# Patient Record
Sex: Female | Born: 1975 | Race: White | Hispanic: No | Marital: Married
Health system: Southern US, Community
[De-identification: ages and names within clinical notes are randomized; demographics above are authoritative.]

---

## 2009-07-27 ENCOUNTER — Ambulatory Visit: Payer: Self-pay

## 2010-05-05 ENCOUNTER — Ambulatory Visit: Payer: Self-pay | Admitting: Internal Medicine

## 2010-05-24 ENCOUNTER — Ambulatory Visit: Payer: Self-pay | Admitting: Internal Medicine

## 2017-03-07 DIAGNOSIS — Z1231 Encounter for screening mammogram for malignant neoplasm of breast: Secondary | ICD-10-CM | POA: Diagnosis not present

## 2017-03-07 DIAGNOSIS — Z0000 Encounter for general adult medical examination without abnormal findings: Secondary | ICD-10-CM | POA: Diagnosis not present

## 2017-03-07 DIAGNOSIS — Z124 Encounter for screening for malignant neoplasm of cervix: Secondary | ICD-10-CM | POA: Diagnosis not present

## 2017-03-14 DIAGNOSIS — Z0000 Encounter for general adult medical examination without abnormal findings: Secondary | ICD-10-CM | POA: Diagnosis not present

## 2019-09-29 ENCOUNTER — Other Ambulatory Visit: Payer: Self-pay | Admitting: Physician Assistant

## 2019-09-29 DIAGNOSIS — Z1231 Encounter for screening mammogram for malignant neoplasm of breast: Secondary | ICD-10-CM

## 2019-10-03 ENCOUNTER — Ambulatory Visit
Admission: RE | Admit: 2019-10-03 | Discharge: 2019-10-03 | Disposition: A | Discharge status: Home / Self Care | Payer: BC Managed Care – PPO | Source: Ambulatory Visit | Attending: Physician Assistant | Admitting: Physician Assistant

## 2019-10-03 ENCOUNTER — Other Ambulatory Visit: Payer: Self-pay

## 2019-10-03 DIAGNOSIS — Z1231 Encounter for screening mammogram for malignant neoplasm of breast: Secondary | ICD-10-CM

## 2020-09-01 ENCOUNTER — Other Ambulatory Visit: Payer: Self-pay | Admitting: Physician Assistant

## 2020-09-01 DIAGNOSIS — Z1231 Encounter for screening mammogram for malignant neoplasm of breast: Secondary | ICD-10-CM

## 2021-06-19 IMAGING — MG DIGITAL SCREENING BILAT W/ TOMO W/ CAD
8 series · 9 of 24 positions shown · non-contrast
Comparison: Previous exam(s).

CLINICAL DATA: Screening.

EXAM:
DIGITAL SCREENING BILATERAL MAMMOGRAM WITH TOMO AND CAD

[L CC synth-2D]
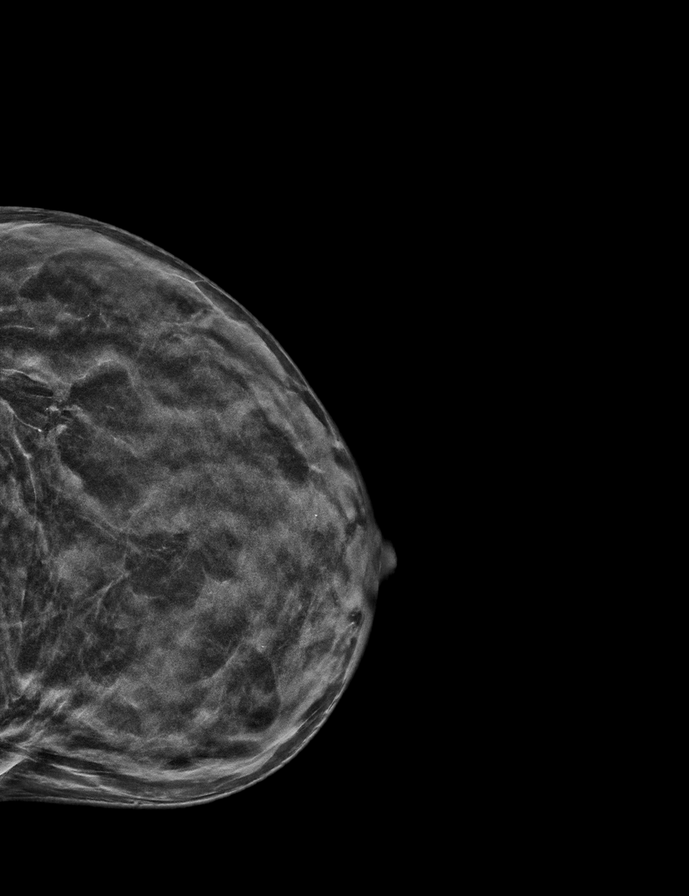

[L MLO synth-2D]
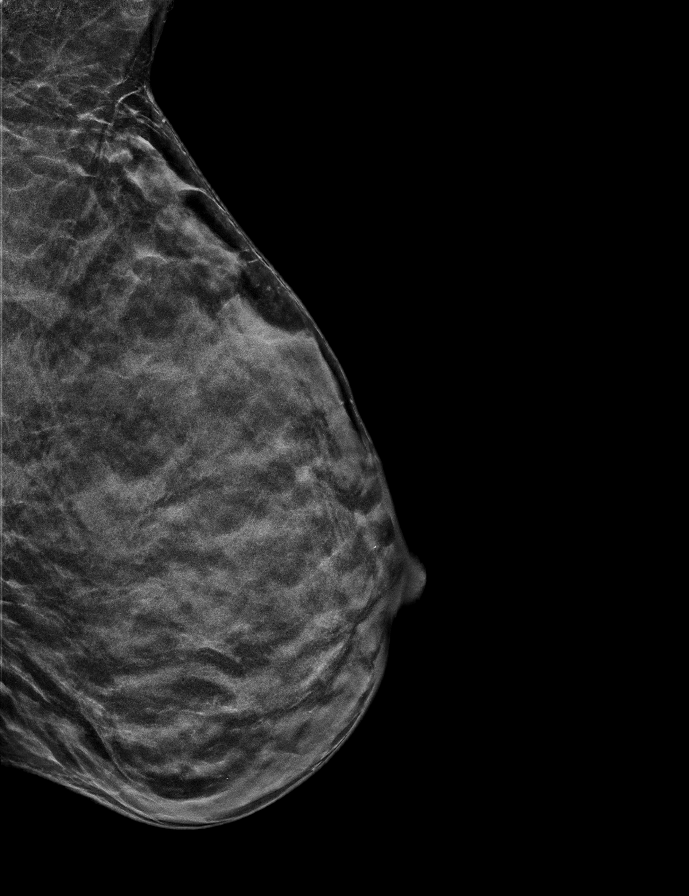

[R MLO synth-2D]
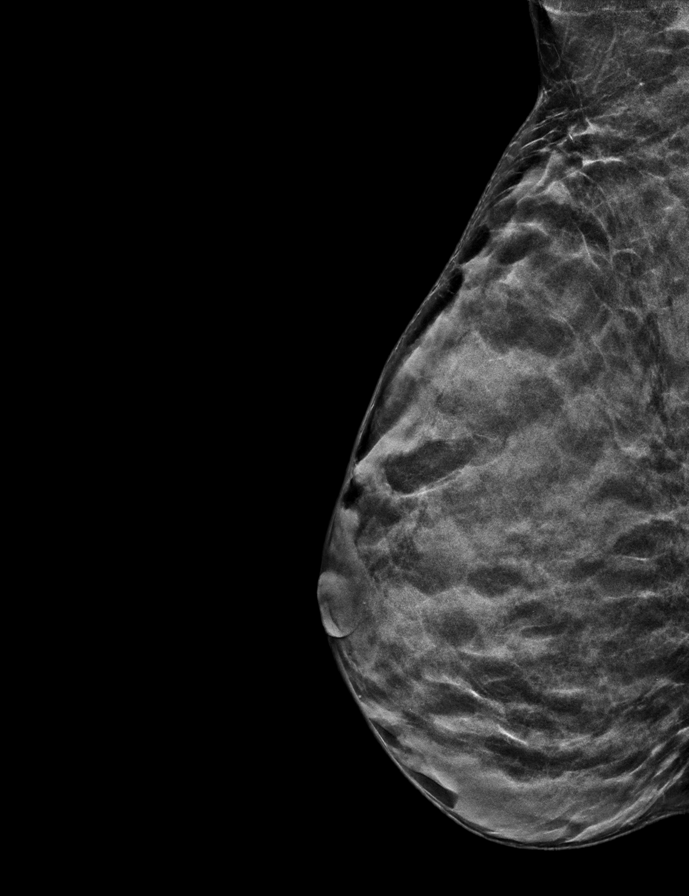

[R CC synth-2D]
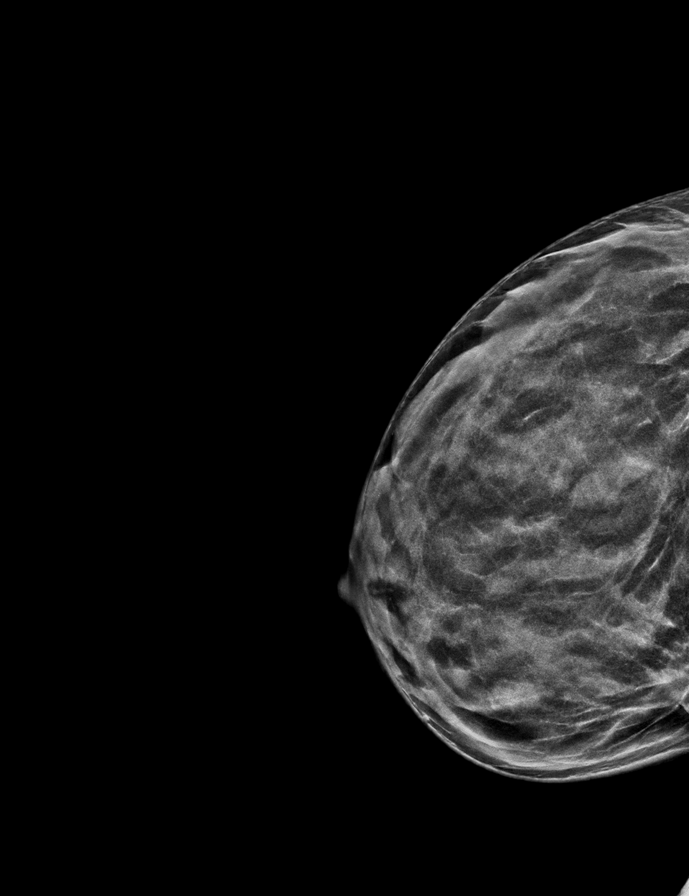

[L CC tomo · 2 of 45 frames shown]
[frame 15/45]
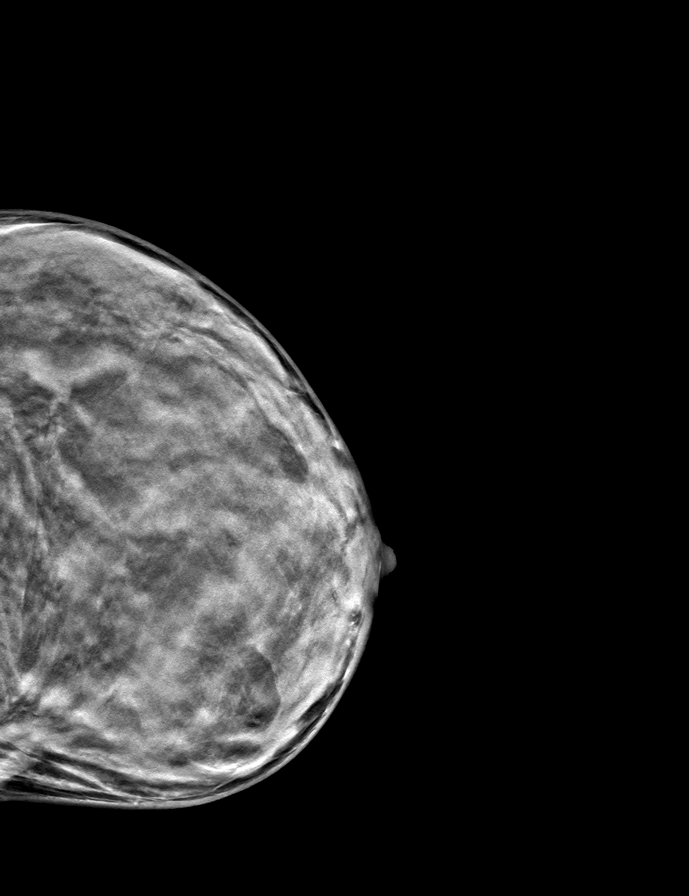
[frame 23/45]
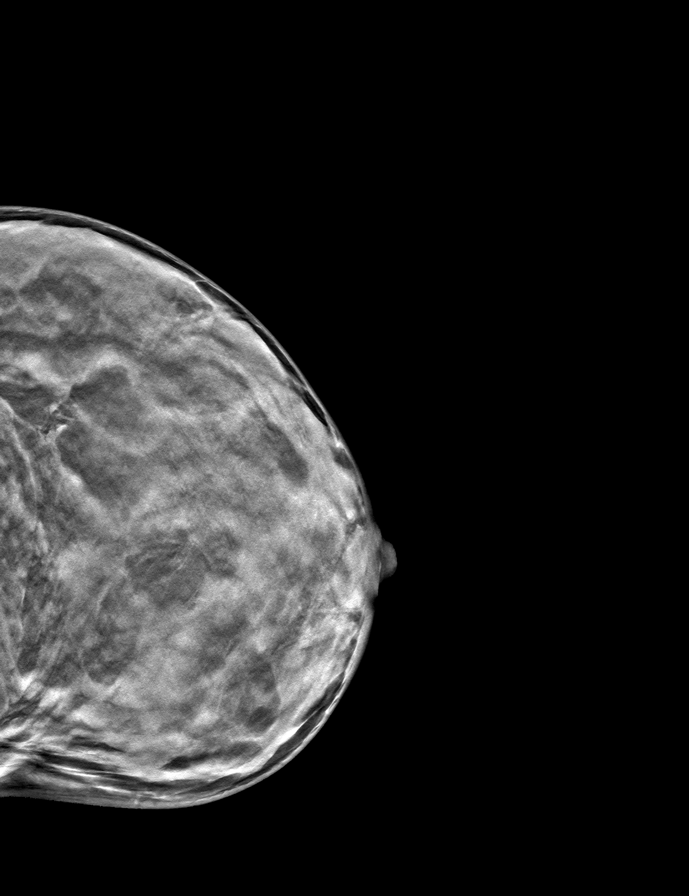

[R MLO tomo · tomo slice 21/42.0]
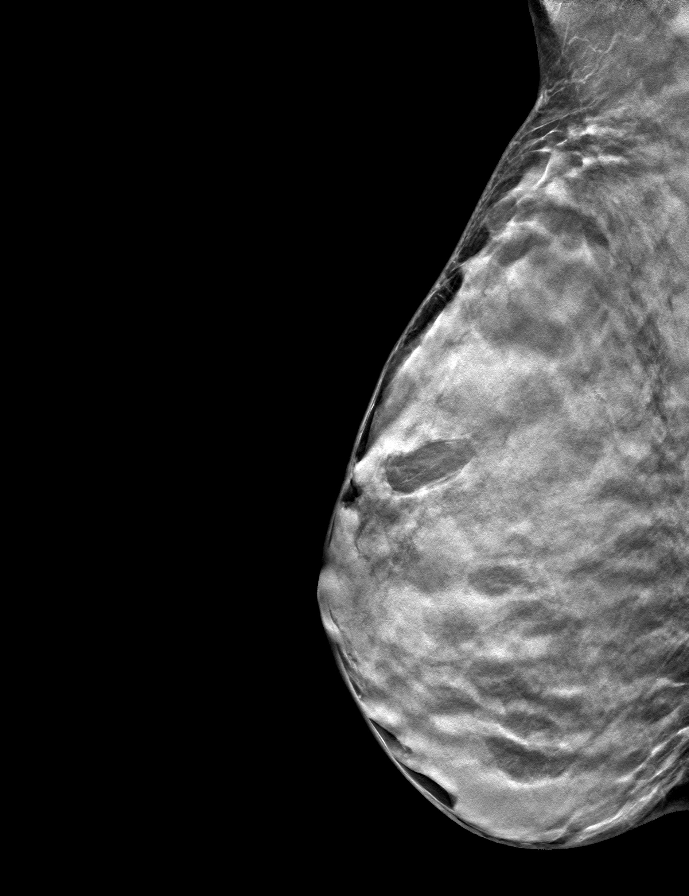

[L MLO tomo · tomo slice 21/42.0]
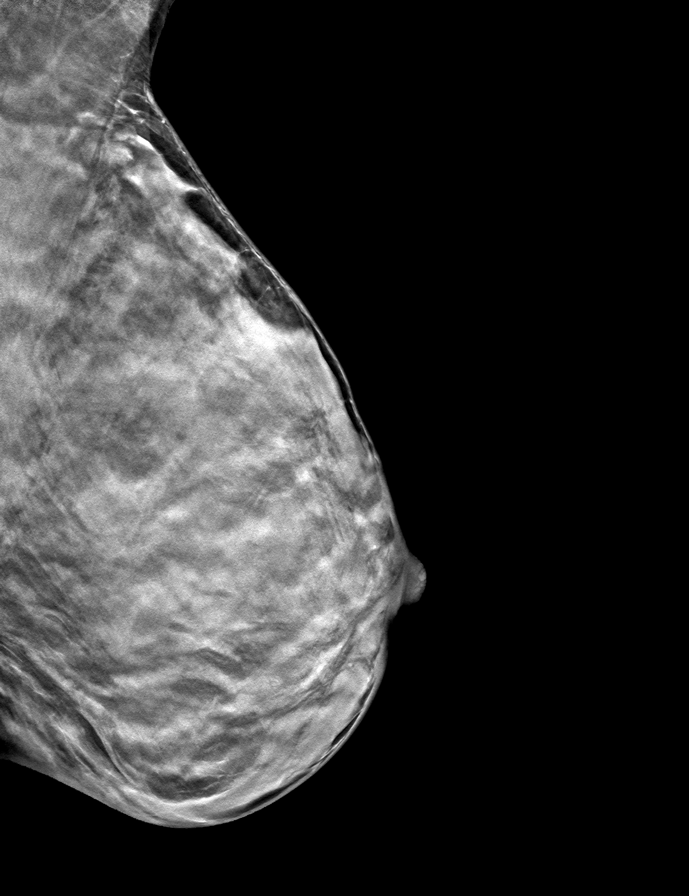

[R CC tomo · tomo slice 25/49.0]
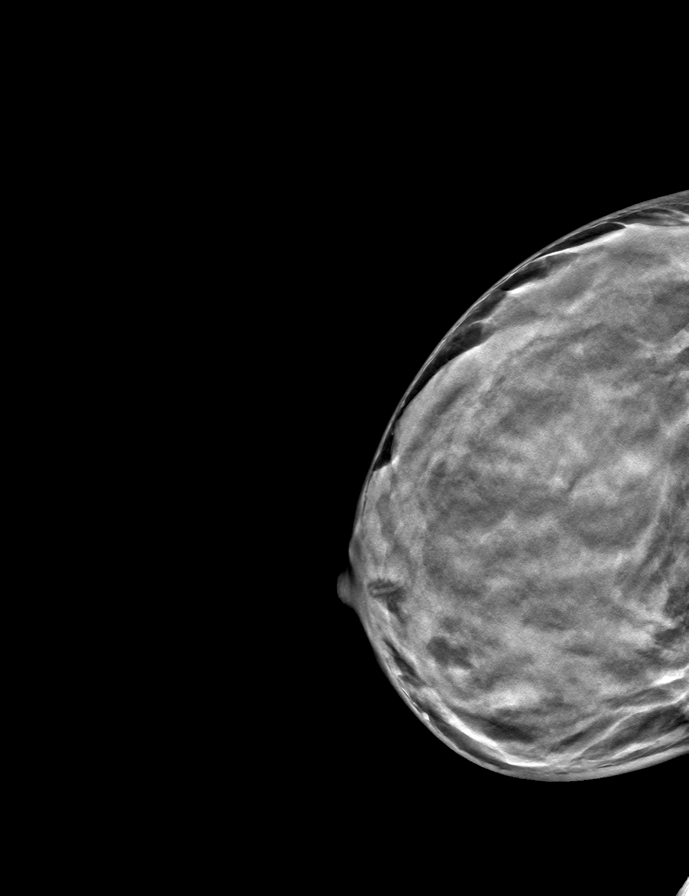

[9 of 24 positions shown; findings below may reference images not displayed]

ACR Breast Density Category d: The breast tissue is extremely dense,
which lowers the sensitivity of mammography
FINDINGS: There are no findings suspicious for malignancy. Images were
processed with CAD.
IMPRESSION: No mammographic evidence of malignancy. A result letter of this
screening mammogram will be mailed directly to the patient.

RECOMMENDATION:
Screening mammogram in one year. (Code:WO-[id number])

BI-RADS CATEGORY  1: Negative.

## 2021-09-05 ENCOUNTER — Other Ambulatory Visit: Payer: Self-pay | Admitting: Physician Assistant

## 2021-09-05 DIAGNOSIS — Z1231 Encounter for screening mammogram for malignant neoplasm of breast: Secondary | ICD-10-CM

## 2021-09-08 ENCOUNTER — Ambulatory Visit
Admission: EM | Admit: 2021-09-08 | Discharge: 2021-09-08 | Disposition: A | Discharge status: Home / Self Care | Payer: BC Managed Care – PPO | Attending: Emergency Medicine | Admitting: Emergency Medicine

## 2021-09-08 DIAGNOSIS — J029 Acute pharyngitis, unspecified: Secondary | ICD-10-CM | POA: Diagnosis not present

## 2021-09-08 LAB — POCT RAPID STREP A (OFFICE): Rapid Strep A Screen: NEGATIVE

## 2021-09-08 MED ORDER — LIDOCAINE VISCOUS HCL 2 % MT SOLN: 15.0000 mL | OROMUCOSAL | 0 refills | Status: AC | PRN

## 2021-09-08 NOTE — Discharge Instructions (Addendum)
The strep test is negative.  Use the viscous lidocaine as needed.  Follow up with your primary care provider if your symptoms are not improving.

## 2021-09-08 NOTE — ED Provider Notes (Signed)
Renee Pruitt    CSN: [id number] Arrival date & time: 09/08/21  1742      History   Chief Complaint Chief Complaint  Patient presents with   Sore Throat    HPI Renee Pruitt is a 46 y.o. female.  Patient presents with 2-day history of sore throat and swollen lymph nodes.  No fever, rash, cough, shortness of breath, vomiting, diarrhea, or other symptoms.  Treatment at home ibuprofen.  No pertinent medical history.  The history is provided by the patient.    History reviewed. No pertinent past medical history.  There are no problems to display for this patient.   History reviewed. No pertinent surgical history.  OB History   No obstetric history on file.      Home Medications    Prior to Admission medications   Medication Sig Start Date End Date Taking? Authorizing Provider  lidocaine (XYLOCAINE) 2 % solution Use as directed 15 mLs in the mouth or throat as needed for mouth pain. 09/08/21  Yes Mickie Bail, NP    Family History Family History  Problem Relation Age of Onset   Breast cancer Maternal Aunt 57    Social History     Allergies   Patient has no known allergies.   Review of Systems Review of Systems  Constitutional:  Negative for chills and fever.  HENT:  Positive for sore throat. Negative for ear pain.   Respiratory:  Negative for cough and shortness of breath.   Cardiovascular:  Negative for chest pain and palpitations.  Gastrointestinal:  Negative for abdominal pain, diarrhea and vomiting.  Skin:  Negative for color change and rash.  All other systems reviewed and are negative.    Physical Exam Triage Vital Signs ED Triage Vitals  Enc Vitals Group     BP      Pulse      Resp      Temp      Temp src      SpO2      Weight      Height      Head Circumference      Peak Flow      Pain Score      Pain Loc      Pain Edu?      Excl. in GC?    No data found.  Updated Vital Signs BP (!) 135/95   Pulse (!) 110   Temp  98.3 F (36.8 C)   Resp 18   LMP 08/28/2019   SpO2 98%   Visual Acuity Right Eye Distance:   Left Eye Distance:   Bilateral Distance:    Right Eye Near:   Left Eye Near:    Bilateral Near:     Physical Exam Vitals and nursing note reviewed.  Constitutional:      General: She is not in acute distress.    Appearance: Normal appearance. She is well-developed. She is not ill-appearing.  HENT:     Right Ear: Tympanic membrane normal.     Left Ear: Tympanic membrane normal.     Nose: Nose normal.     Mouth/Throat:     Mouth: Mucous membranes are moist.     Pharynx: Posterior oropharyngeal erythema present.  Cardiovascular:     Rate and Rhythm: Normal rate and regular rhythm.     Heart sounds: Normal heart sounds.  Pulmonary:     Effort: Pulmonary effort is normal. No respiratory distress.     Breath sounds:  Normal breath sounds.  Musculoskeletal:     Cervical back: Neck supple.  Skin:    General: Skin is warm and dry.  Neurological:     Mental Status: She is alert.  Psychiatric:        Mood and Affect: Mood normal.        Behavior: Behavior normal.      UC Treatments / Results  Labs (all labs ordered are listed, but only abnormal results are displayed) Labs Reviewed  POCT RAPID STREP A (OFFICE)    EKG   Radiology No results found.  Procedures Procedures (including critical care time)  Medications Ordered in UC Medications - No data to display  Initial Impression / Assessment and Plan / UC Course  I have reviewed the triage vital signs and the nursing notes.  Pertinent labs & imaging results that were available during my care of the patient were reviewed by me and considered in my medical decision making (see chart for details).    Sore throat.  Rapid strep negative.  Discussed symptomatic treatment including Tylenol or ibuprofen.  Also treating with viscous lidocaine.  Education provided on sore throat.  Instructed patient to follow up with her PCP if  her symptoms are not improving.  She agrees to plan of care.    Final Clinical Impressions(s) / UC Diagnoses   Final diagnoses:  Sore throat     Discharge Instructions      The strep test is negative.  Use the viscous lidocaine as needed.  Follow up with your primary care provider if your symptoms are not improving.        ED Prescriptions     Medication Sig Dispense Auth. Provider   lidocaine (XYLOCAINE) 2 % solution Use as directed 15 mLs in the mouth or throat as needed for mouth pain. 100 mL Mickie Bail, NP      PDMP not reviewed this encounter.   Mickie Bail, NP 09/08/21 1850

## 2021-09-08 NOTE — ED Triage Notes (Signed)
Pt presents with sore throat x 2 days.  Swollen lymph nodes.

## 2021-09-28 ENCOUNTER — Ambulatory Visit
Admission: RE | Admit: 2021-09-28 | Discharge: 2021-09-28 | Disposition: A | Discharge status: Home / Self Care | Payer: BC Managed Care – PPO | Source: Ambulatory Visit | Attending: Physician Assistant | Admitting: Physician Assistant

## 2021-09-28 DIAGNOSIS — Z1231 Encounter for screening mammogram for malignant neoplasm of breast: Secondary | ICD-10-CM | POA: Insufficient documentation

## 2022-11-07 ENCOUNTER — Other Ambulatory Visit: Payer: Self-pay | Admitting: Physician Assistant

## 2022-11-07 DIAGNOSIS — Z1231 Encounter for screening mammogram for malignant neoplasm of breast: Secondary | ICD-10-CM

## 2023-01-12 ENCOUNTER — Ambulatory Visit
Admission: RE | Admit: 2023-01-12 | Discharge: 2023-01-12 | Disposition: A | Discharge status: Home / Self Care | Payer: Managed Care, Other (non HMO) | Source: Ambulatory Visit | Attending: Physician Assistant | Admitting: Physician Assistant

## 2023-01-12 DIAGNOSIS — Z1231 Encounter for screening mammogram for malignant neoplasm of breast: Secondary | ICD-10-CM | POA: Insufficient documentation
